# Patient Record
Sex: Male | Born: 1975
Health system: Southern US, Community
[De-identification: ages and names within clinical notes are randomized; demographics above are authoritative.]

## PROBLEM LIST (undated history)

## (undated) DIAGNOSIS — F419 Anxiety disorder, unspecified: Secondary | ICD-10-CM

## (undated) DIAGNOSIS — R002 Palpitations: Secondary | ICD-10-CM

## (undated) DIAGNOSIS — A6000 Herpesviral infection of urogenital system, unspecified: Secondary | ICD-10-CM

## (undated) DIAGNOSIS — M545 Low back pain, unspecified: Secondary | ICD-10-CM

## (undated) DIAGNOSIS — I10 Essential (primary) hypertension: Secondary | ICD-10-CM

## (undated) DIAGNOSIS — E7800 Pure hypercholesterolemia, unspecified: Secondary | ICD-10-CM

## (undated) DIAGNOSIS — R55 Syncope and collapse: Secondary | ICD-10-CM

## (undated) DIAGNOSIS — K219 Gastro-esophageal reflux disease without esophagitis: Secondary | ICD-10-CM

## (undated) HISTORY — DX: Low back pain: M54.5

## (undated) HISTORY — DX: Essential (primary) hypertension: I10

## (undated) HISTORY — DX: Palpitations: R00.2

## (undated) HISTORY — DX: Syncope and collapse: R55

## (undated) HISTORY — DX: Gastro-esophageal reflux disease without esophagitis: K21.9

## (undated) HISTORY — DX: Pure hypercholesterolemia, unspecified: E78.00

## (undated) HISTORY — DX: Anxiety disorder, unspecified: F41.9

## (undated) HISTORY — DX: Herpesviral infection of urogenital system, unspecified: A60.00

## (undated) HISTORY — PX: CHOLECYSTECTOMY: SHX55

## (undated) HISTORY — DX: Low back pain, unspecified: M54.50

---

## 2015-04-25 ENCOUNTER — Emergency Department (HOSPITAL_COMMUNITY): Payer: Self-pay

## 2015-04-25 ENCOUNTER — Emergency Department (HOSPITAL_COMMUNITY)
Admission: EM | Admit: 2015-04-25 | Discharge: 2015-04-26 | Discharge status: Against Medical Advice | Payer: Self-pay | Attending: Emergency Medicine | Admitting: Emergency Medicine

## 2015-04-25 ENCOUNTER — Encounter (HOSPITAL_COMMUNITY): Payer: Self-pay | Admitting: *Deleted

## 2015-04-25 DIAGNOSIS — R079 Chest pain, unspecified: Secondary | ICD-10-CM | POA: Insufficient documentation

## 2015-04-25 LAB — BASIC METABOLIC PANEL
Anion gap: 10 (ref 5–15)
BUN: 18 mg/dL (ref 6–20)
CO2: 26 mmol/L (ref 22–32)
CREATININE: 0.84 mg/dL (ref 0.61–1.24)
Calcium: 9.3 mg/dL (ref 8.9–10.3)
Chloride: 104 mmol/L (ref 101–111)
GFR calc Af Amer: >60 mL/min (ref >60)
GFR calc non Af Amer: >60 mL/min (ref >60)
GLUCOSE: 97 mg/dL (ref 70–99)
POTASSIUM: 3.7 mmol/L (ref 3.5–5.1)
Sodium: 140 mmol/L (ref 135–145)

## 2015-04-25 LAB — CBC
HCT: 45.2 % (ref 39.0–52.0)
Hemoglobin: 15.7 g/dL (ref 13.0–17.0)
MCH: 30.8 pg (ref 26.0–34.0)
MCHC: 34.7 g/dL (ref 30.0–36.0)
MCV: 88.8 fL (ref 78.0–100.0)
Platelets: 231 10*3/uL (ref 150–400)
RBC: 5.09 MIL/uL (ref 4.22–5.81)
RDW: 11.6 % (ref 11.5–15.5)
WBC: 5.2 10*3/uL (ref 4.0–10.5)

## 2015-04-25 LAB — I-STAT TROPONIN, ED: TROPONIN I, POC: 0 ng/mL (ref 0.00–0.08)

## 2015-04-25 NOTE — ED Notes (Signed)
Called for re-assessment with no answer 

## 2015-04-25 NOTE — ED Notes (Signed)
Pt has had intermittent chest pain over the last year.  Today he developed left sided chest pain that radiated down his left arm.  Pt states it has been continuous all day, nothing different.  No sob.  Pt has had some weird breakouts into sweats and hot flashes.  No family history

## 2015-04-25 NOTE — ED Notes (Signed)
Pts name called for a room no answer 

## 2015-12-30 IMAGING — CR DG CHEST 2V
2 series · 2 of 2 positions shown · non-contrast
Comparison: None.

CLINICAL DATA: Chest pain. Initial encounter. LEFT-sided chest pain
and arm pain.

EXAM:
CHEST  2 VIEW

[w chest pa]
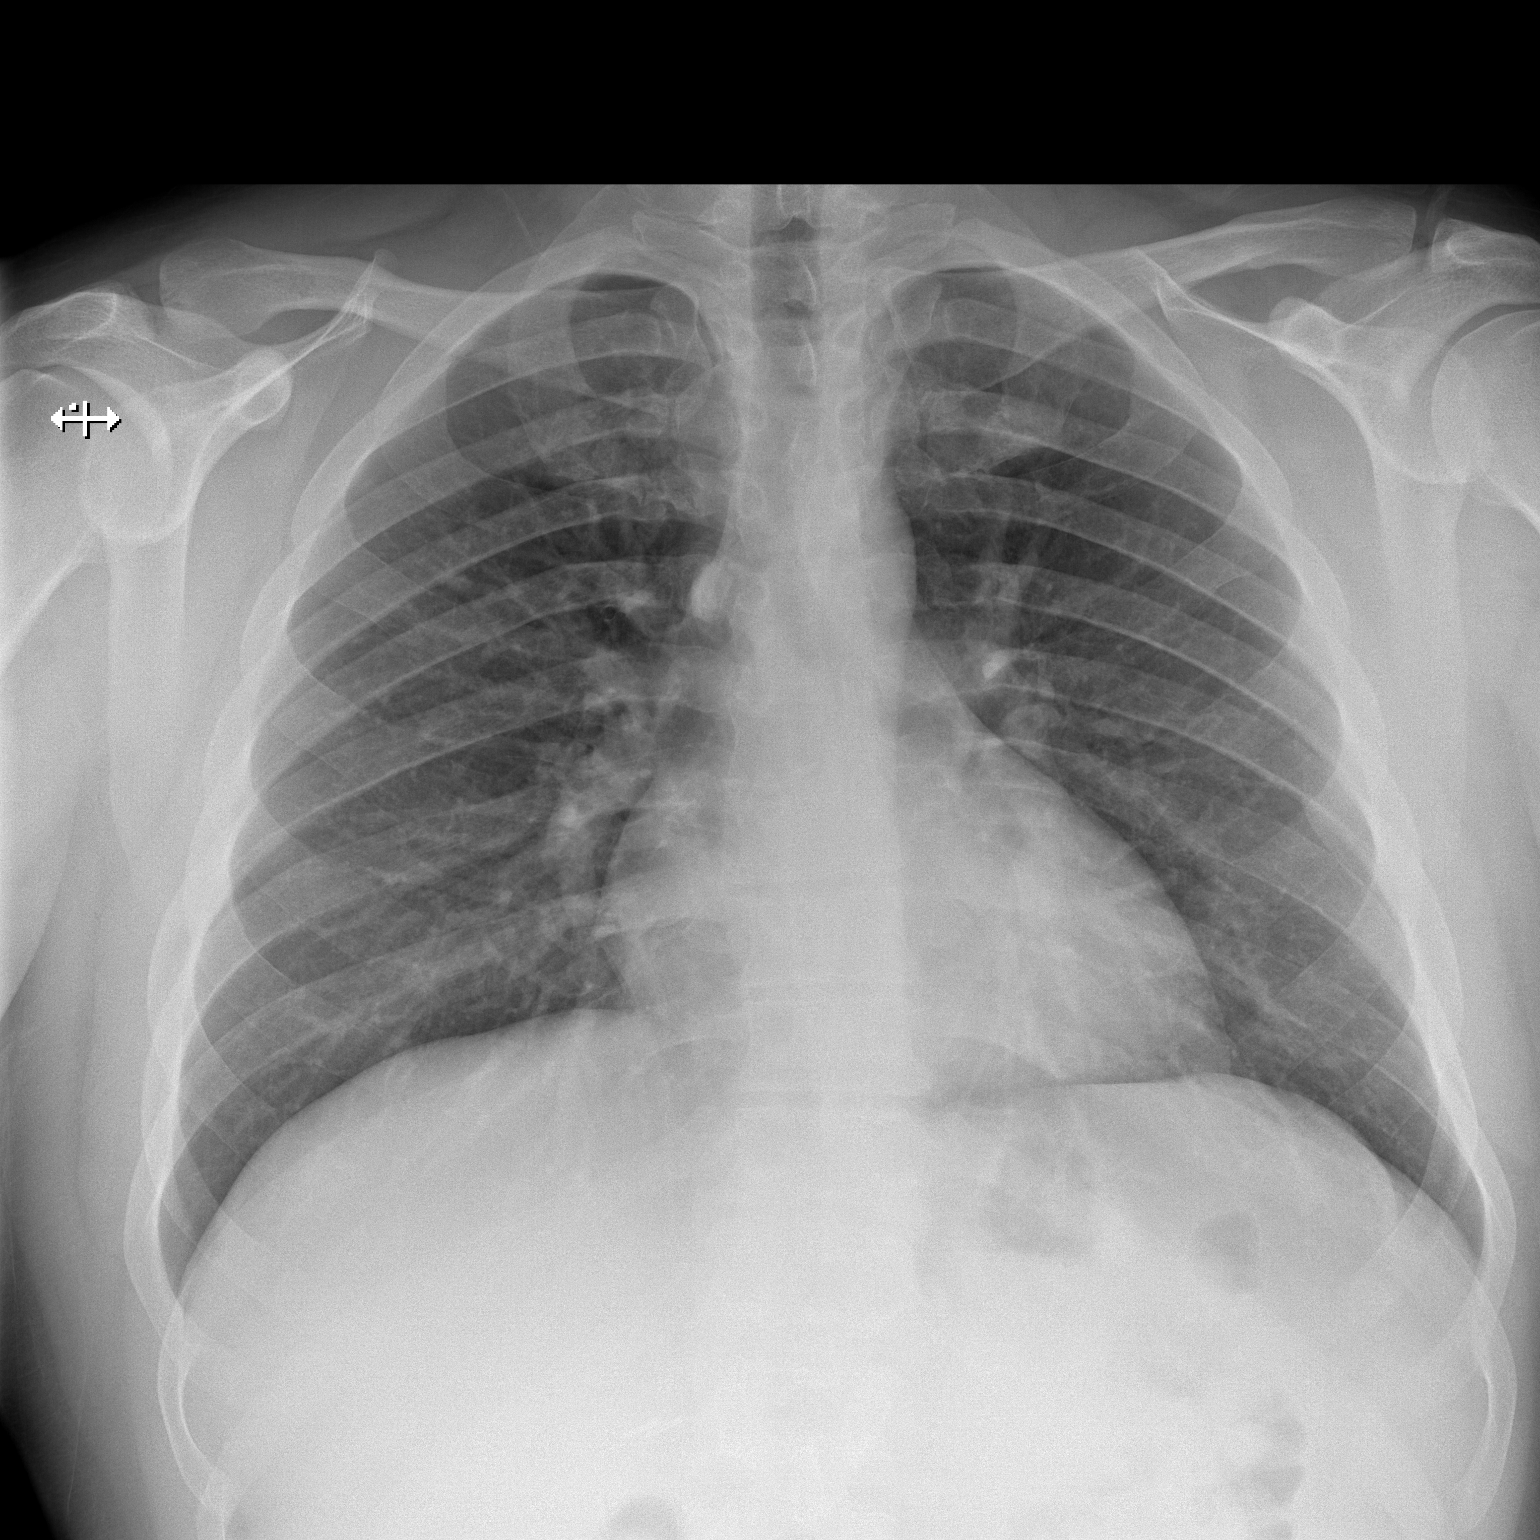

[w chest lat]
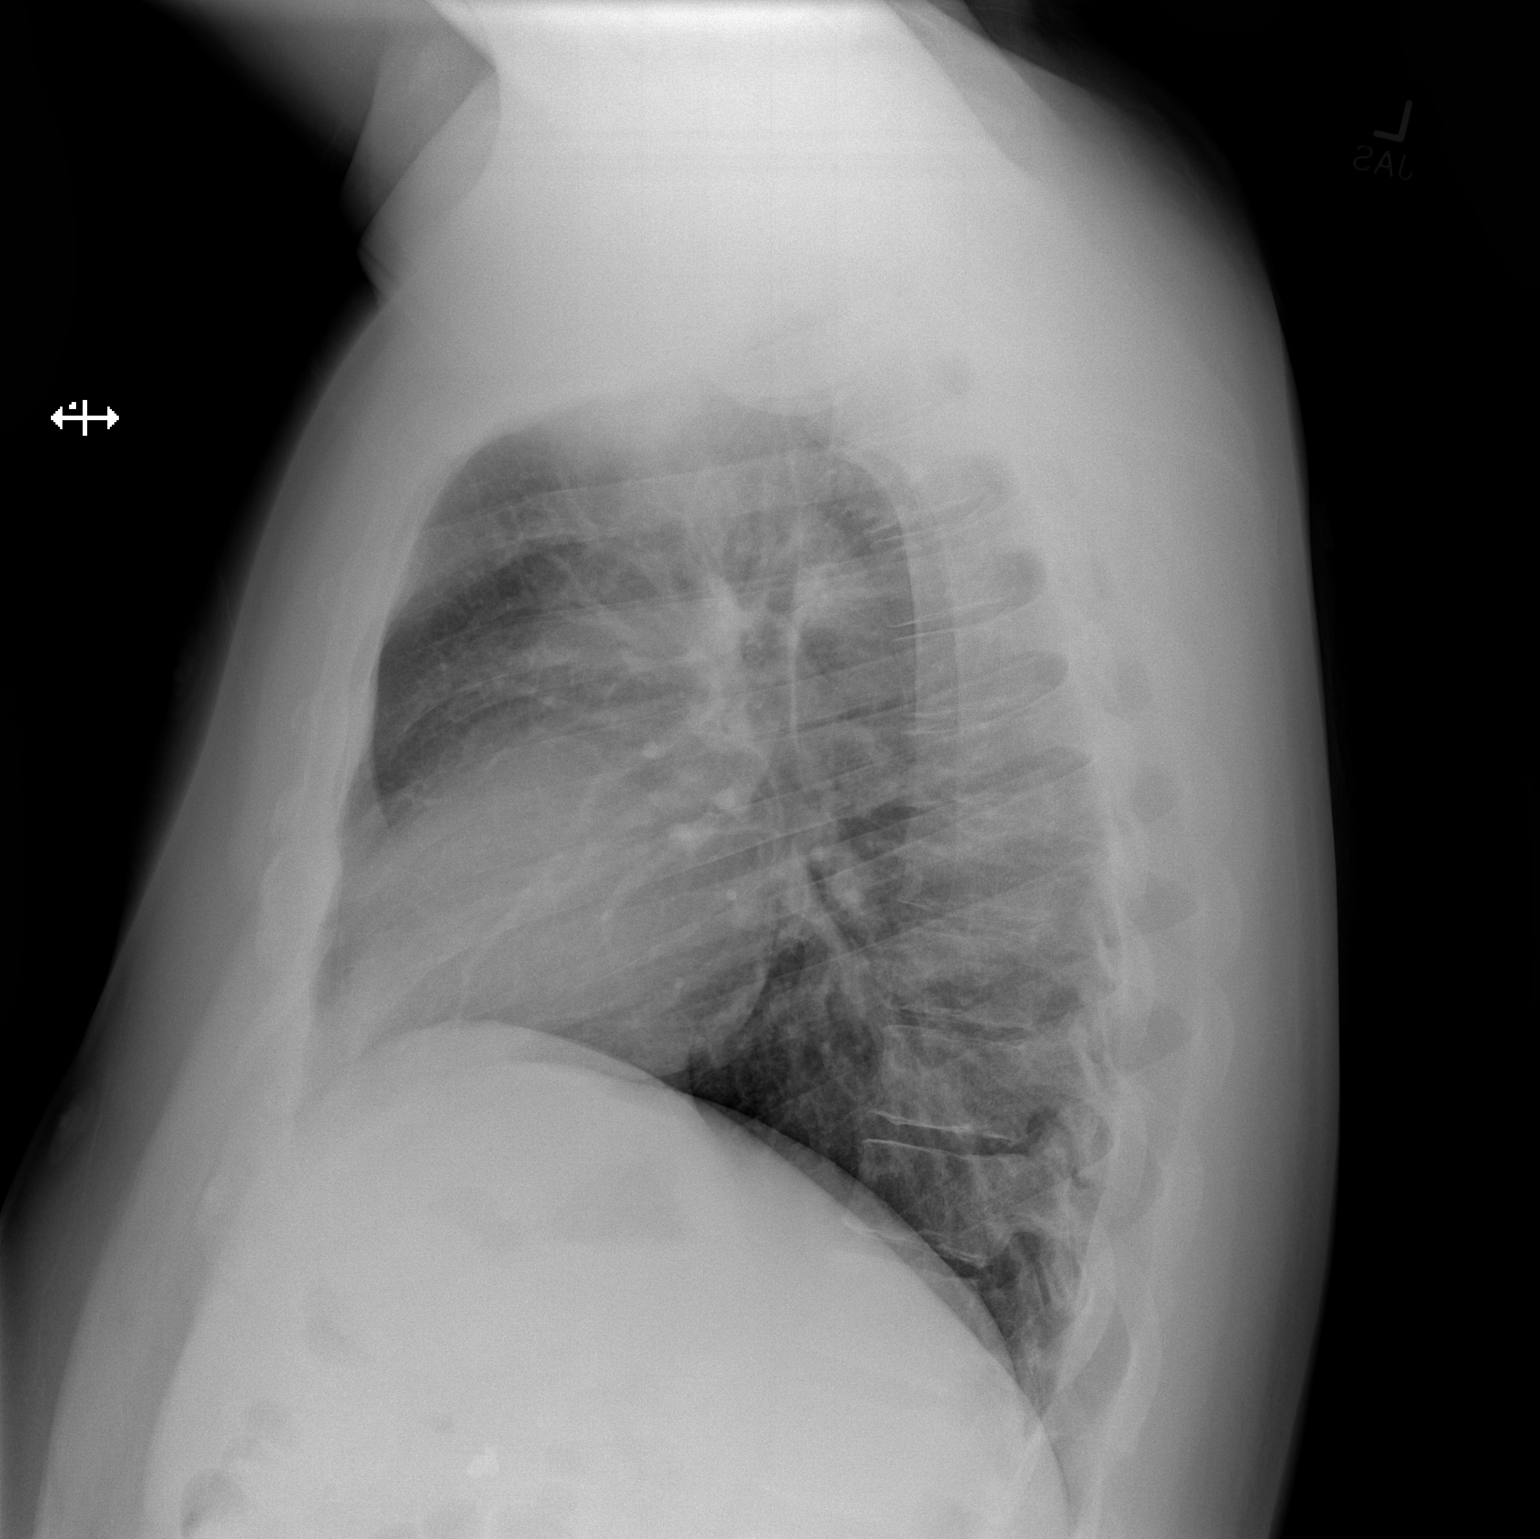

[2 of 2 positions shown; findings below may reference images not displayed]

FINDINGS: The heart size and mediastinal contours are within normal limits.
Both lungs are clear. The visualized skeletal structures are
unremarkable.
IMPRESSION: No active cardiopulmonary disease.

## 2016-07-05 DIAGNOSIS — K219 Gastro-esophageal reflux disease without esophagitis: Secondary | ICD-10-CM | POA: Diagnosis not present

## 2016-07-05 DIAGNOSIS — G4733 Obstructive sleep apnea (adult) (pediatric): Secondary | ICD-10-CM | POA: Diagnosis not present

## 2016-07-05 DIAGNOSIS — R42 Dizziness and giddiness: Secondary | ICD-10-CM | POA: Diagnosis not present

## 2016-07-05 DIAGNOSIS — F419 Anxiety disorder, unspecified: Secondary | ICD-10-CM | POA: Diagnosis not present

## 2016-07-23 DIAGNOSIS — F419 Anxiety disorder, unspecified: Secondary | ICD-10-CM | POA: Diagnosis not present

## 2016-07-23 DIAGNOSIS — G4733 Obstructive sleep apnea (adult) (pediatric): Secondary | ICD-10-CM | POA: Diagnosis not present

## 2016-07-23 DIAGNOSIS — M5416 Radiculopathy, lumbar region: Secondary | ICD-10-CM | POA: Diagnosis not present

## 2016-07-23 DIAGNOSIS — E782 Mixed hyperlipidemia: Secondary | ICD-10-CM | POA: Diagnosis not present

## 2016-08-01 DIAGNOSIS — M545 Low back pain: Secondary | ICD-10-CM | POA: Diagnosis not present

## 2016-10-02 DIAGNOSIS — M47898 Other spondylosis, sacral and sacrococcygeal region: Secondary | ICD-10-CM | POA: Diagnosis not present

## 2016-10-02 DIAGNOSIS — M545 Low back pain: Secondary | ICD-10-CM | POA: Diagnosis not present

## 2016-10-02 DIAGNOSIS — M48061 Spinal stenosis, lumbar region without neurogenic claudication: Secondary | ICD-10-CM | POA: Diagnosis not present

## 2016-10-02 DIAGNOSIS — M5126 Other intervertebral disc displacement, lumbar region: Secondary | ICD-10-CM | POA: Diagnosis not present

## 2016-10-02 DIAGNOSIS — M5127 Other intervertebral disc displacement, lumbosacral region: Secondary | ICD-10-CM | POA: Diagnosis not present

## 2016-10-02 DIAGNOSIS — M79604 Pain in right leg: Secondary | ICD-10-CM | POA: Diagnosis not present

## 2016-10-02 DIAGNOSIS — M79605 Pain in left leg: Secondary | ICD-10-CM | POA: Diagnosis not present

## 2016-10-02 DIAGNOSIS — R200 Anesthesia of skin: Secondary | ICD-10-CM | POA: Diagnosis not present

## 2016-10-02 DIAGNOSIS — M9943 Connective tissue stenosis of neural canal of lumbar region: Secondary | ICD-10-CM | POA: Diagnosis not present

## 2016-10-11 DIAGNOSIS — M48061 Spinal stenosis, lumbar region without neurogenic claudication: Secondary | ICD-10-CM | POA: Diagnosis not present

## 2016-10-25 DIAGNOSIS — M48061 Spinal stenosis, lumbar region without neurogenic claudication: Secondary | ICD-10-CM | POA: Diagnosis not present

## 2016-11-11 DIAGNOSIS — I10 Essential (primary) hypertension: Secondary | ICD-10-CM | POA: Diagnosis not present

## 2016-11-11 DIAGNOSIS — M25562 Pain in left knee: Secondary | ICD-10-CM | POA: Diagnosis not present

## 2016-11-11 DIAGNOSIS — M25561 Pain in right knee: Secondary | ICD-10-CM | POA: Diagnosis not present

## 2017-01-02 DIAGNOSIS — G4733 Obstructive sleep apnea (adult) (pediatric): Secondary | ICD-10-CM | POA: Diagnosis not present

## 2017-01-02 DIAGNOSIS — M5416 Radiculopathy, lumbar region: Secondary | ICD-10-CM | POA: Diagnosis not present

## 2017-01-02 DIAGNOSIS — E782 Mixed hyperlipidemia: Secondary | ICD-10-CM | POA: Diagnosis not present

## 2017-01-02 DIAGNOSIS — F419 Anxiety disorder, unspecified: Secondary | ICD-10-CM | POA: Diagnosis not present

## 2017-10-28 DIAGNOSIS — R5383 Other fatigue: Secondary | ICD-10-CM | POA: Diagnosis not present

## 2017-10-28 DIAGNOSIS — Z6838 Body mass index (BMI) 38.0-38.9, adult: Secondary | ICD-10-CM | POA: Diagnosis not present

## 2017-10-28 DIAGNOSIS — Z202 Contact with and (suspected) exposure to infections with a predominantly sexual mode of transmission: Secondary | ICD-10-CM | POA: Diagnosis not present

## 2017-10-28 DIAGNOSIS — B372 Candidiasis of skin and nail: Secondary | ICD-10-CM | POA: Diagnosis not present

## 2017-10-28 DIAGNOSIS — E782 Mixed hyperlipidemia: Secondary | ICD-10-CM | POA: Diagnosis not present

## 2018-01-27 DIAGNOSIS — Z6830 Body mass index (BMI) 30.0-30.9, adult: Secondary | ICD-10-CM | POA: Diagnosis not present

## 2018-01-27 DIAGNOSIS — M48061 Spinal stenosis, lumbar region without neurogenic claudication: Secondary | ICD-10-CM | POA: Diagnosis not present

## 2018-02-03 DIAGNOSIS — M48061 Spinal stenosis, lumbar region without neurogenic claudication: Secondary | ICD-10-CM | POA: Diagnosis not present

## 2018-02-20 DIAGNOSIS — M4316 Spondylolisthesis, lumbar region: Secondary | ICD-10-CM | POA: Diagnosis not present

## 2018-02-20 DIAGNOSIS — M48061 Spinal stenosis, lumbar region without neurogenic claudication: Secondary | ICD-10-CM | POA: Diagnosis not present

## 2018-02-20 DIAGNOSIS — Z6830 Body mass index (BMI) 30.0-30.9, adult: Secondary | ICD-10-CM | POA: Diagnosis not present

## 2018-02-27 DIAGNOSIS — H6502 Acute serous otitis media, left ear: Secondary | ICD-10-CM | POA: Diagnosis not present

## 2018-02-27 DIAGNOSIS — Z6839 Body mass index (BMI) 39.0-39.9, adult: Secondary | ICD-10-CM | POA: Diagnosis not present

## 2018-06-16 DIAGNOSIS — Z6839 Body mass index (BMI) 39.0-39.9, adult: Secondary | ICD-10-CM | POA: Diagnosis not present

## 2018-06-16 DIAGNOSIS — M7041 Prepatellar bursitis, right knee: Secondary | ICD-10-CM | POA: Diagnosis not present

## 2018-07-02 DIAGNOSIS — M7989 Other specified soft tissue disorders: Secondary | ICD-10-CM | POA: Diagnosis not present

## 2018-07-02 DIAGNOSIS — I10 Essential (primary) hypertension: Secondary | ICD-10-CM | POA: Diagnosis not present

## 2018-07-02 DIAGNOSIS — L03114 Cellulitis of left upper limb: Secondary | ICD-10-CM | POA: Diagnosis not present

## 2018-09-18 DIAGNOSIS — Z202 Contact with and (suspected) exposure to infections with a predominantly sexual mode of transmission: Secondary | ICD-10-CM | POA: Diagnosis not present

## 2018-09-18 DIAGNOSIS — Z6837 Body mass index (BMI) 37.0-37.9, adult: Secondary | ICD-10-CM | POA: Diagnosis not present

## 2018-09-18 DIAGNOSIS — R634 Abnormal weight loss: Secondary | ICD-10-CM | POA: Diagnosis not present

## 2018-09-18 DIAGNOSIS — R5383 Other fatigue: Secondary | ICD-10-CM | POA: Diagnosis not present

## 2018-10-30 DIAGNOSIS — R5383 Other fatigue: Secondary | ICD-10-CM | POA: Diagnosis not present

## 2018-10-30 DIAGNOSIS — Z6837 Body mass index (BMI) 37.0-37.9, adult: Secondary | ICD-10-CM | POA: Diagnosis not present

## 2018-10-30 DIAGNOSIS — B351 Tinea unguium: Secondary | ICD-10-CM | POA: Diagnosis not present

## 2018-10-30 DIAGNOSIS — R634 Abnormal weight loss: Secondary | ICD-10-CM | POA: Diagnosis not present

## 2018-12-04 DIAGNOSIS — R5383 Other fatigue: Secondary | ICD-10-CM | POA: Diagnosis not present

## 2018-12-04 DIAGNOSIS — Z6827 Body mass index (BMI) 27.0-27.9, adult: Secondary | ICD-10-CM | POA: Diagnosis not present

## 2018-12-04 DIAGNOSIS — R634 Abnormal weight loss: Secondary | ICD-10-CM | POA: Diagnosis not present

## 2018-12-04 DIAGNOSIS — L0291 Cutaneous abscess, unspecified: Secondary | ICD-10-CM | POA: Diagnosis not present

## 2019-01-08 DIAGNOSIS — F419 Anxiety disorder, unspecified: Secondary | ICD-10-CM | POA: Diagnosis not present

## 2019-01-08 DIAGNOSIS — Z6829 Body mass index (BMI) 29.0-29.9, adult: Secondary | ICD-10-CM | POA: Diagnosis not present

## 2019-01-08 DIAGNOSIS — R5383 Other fatigue: Secondary | ICD-10-CM | POA: Diagnosis not present

## 2019-01-12 ENCOUNTER — Ambulatory Visit (INDEPENDENT_AMBULATORY_CARE_PROVIDER_SITE_OTHER): Payer: BLUE CROSS/BLUE SHIELD | Admitting: Otolaryngology

## 2019-01-12 DIAGNOSIS — J342 Deviated nasal septum: Secondary | ICD-10-CM | POA: Diagnosis not present

## 2019-01-12 DIAGNOSIS — R490 Dysphonia: Secondary | ICD-10-CM | POA: Diagnosis not present

## 2019-01-12 DIAGNOSIS — J310 Chronic rhinitis: Secondary | ICD-10-CM | POA: Diagnosis not present

## 2019-01-30 DIAGNOSIS — F419 Anxiety disorder, unspecified: Secondary | ICD-10-CM | POA: Diagnosis not present

## 2019-01-30 DIAGNOSIS — R634 Abnormal weight loss: Secondary | ICD-10-CM | POA: Diagnosis not present

## 2019-01-30 DIAGNOSIS — R5383 Other fatigue: Secondary | ICD-10-CM | POA: Diagnosis not present

## 2019-01-30 DIAGNOSIS — R300 Dysuria: Secondary | ICD-10-CM | POA: Diagnosis not present

## 2019-01-30 DIAGNOSIS — Z6829 Body mass index (BMI) 29.0-29.9, adult: Secondary | ICD-10-CM | POA: Diagnosis not present

## 2019-02-04 DIAGNOSIS — R079 Chest pain, unspecified: Secondary | ICD-10-CM | POA: Diagnosis not present

## 2019-02-04 DIAGNOSIS — I10 Essential (primary) hypertension: Secondary | ICD-10-CM | POA: Diagnosis not present

## 2019-02-04 DIAGNOSIS — R002 Palpitations: Secondary | ICD-10-CM | POA: Diagnosis not present

## 2019-02-05 DIAGNOSIS — R55 Syncope and collapse: Secondary | ICD-10-CM | POA: Diagnosis not present

## 2019-02-05 DIAGNOSIS — Z6829 Body mass index (BMI) 29.0-29.9, adult: Secondary | ICD-10-CM | POA: Diagnosis not present

## 2019-02-05 DIAGNOSIS — R002 Palpitations: Secondary | ICD-10-CM | POA: Diagnosis not present

## 2019-02-23 ENCOUNTER — Ambulatory Visit (INDEPENDENT_AMBULATORY_CARE_PROVIDER_SITE_OTHER): Payer: BLUE CROSS/BLUE SHIELD | Admitting: Otolaryngology

## 2019-03-03 ENCOUNTER — Encounter: Payer: Self-pay | Admitting: *Deleted

## 2019-03-03 NOTE — Progress Notes (Deleted)
    Cardiology Office Note  Date: 03/03/2019   ID: Glenn Berry, DOB [birth year], MRN [id number]  PCP: Richardean Chimera, MD  Consulting Cardiologist: Nona Dell, MD   No chief complaint on file.   History of Present Illness: Glenn Berry is a 43 y.o. male referred for cardiology consultation by Dr. Reuel Boom for the evaluation of palpitations and near syncope.  Past Medical History:  Diagnosis Date  . Anxiety   . Genital herpes   . GERD (gastroesophageal reflux disease)   . Hypercholesteremia   . Hypertension   . Low back pain     Past Surgical History:  Procedure Laterality Date  . CHOLECYSTECTOMY      Current Outpatient Medications  Medication Sig Dispense Refill  . ibuprofen (ADVIL,MOTRIN) 200 MG tablet Take 200 mg by mouth every 6 (six) hours as needed for moderate pain.    Marland Kitchen omeprazole (PRILOSEC) 20 MG capsule Take 20 mg by mouth daily.     No current facility-administered medications for this visit.    Allergies:  Patient has no known allergies.   Social History: The patient  reports that he quit smoking about 7 years ago. His smoking use included cigarettes. He has never used smokeless tobacco. He reports that he does not drink alcohol or use drugs.   Family History: The patient's family history includes CAD in his mother; Hyperlipidemia in his sister; Hypertension in his mother.   ROS:  Please see the history of present illness. Otherwise, complete review of systems is positive for {NONE DEFAULTED:18576::"none"}.  All other systems are reviewed and negative.   Physical Exam: VS:  There were no vitals taken for this visit., BMI There is no height or weight on file to calculate BMI.  Wt Readings from Last 3 Encounters:  No data found for Wt    General: Patient appears comfortable at rest. HEENT: Conjunctiva and lids normal, oropharynx clear with moist mucosa. Neck: Supple, no elevated JVP or carotid bruits, no thyromegaly. Lungs: Clear to auscultation,  nonlabored breathing at rest. Cardiac: Regular rate and rhythm, no S3 or significant systolic murmur, no pericardial rub. Abdomen: Soft, nontender, no hepatomegaly, bowel sounds present, no guarding or rebound. Extremities: No pitting edema, distal pulses 2+. Skin: Warm and dry. Musculoskeletal: No kyphosis. Neuropsychiatric: Alert and oriented x3, affect grossly appropriate.  ECG: I personally reviewed the tracing from 02/04/2019 which showed sinus rhythm.  Recent Labwork:  February 2020: BUN 14, creatinine 0.94, potassium 4.3, AST 23, ALT 29 globin 16.3, platelets 233 July 2017: Cholesterol 220, triglycerides 349, HDL 44, LDL 106  Other Studies Reviewed Today:  Chest x-ray 04/25/2015: FINDINGS: The heart size and mediastinal contours are within normal limits. Both lungs are clear. The visualized skeletal structures are unremarkable.  IMPRESSION: No active cardiopulmonary disease.  Assessment and Plan:    Current medicines were reviewed with the patient today.  No orders of the defined types were placed in this encounter.   Disposition:  Signed, Jonelle Sidle, MD, [facility] 03/03/2019 3:29 PM    [facility] at [facility] [address] [place], [place], [place] [address] Phone: [phone]; Fax: [phone]

## 2019-03-04 ENCOUNTER — Ambulatory Visit: Payer: BLUE CROSS/BLUE SHIELD | Admitting: Cardiology

## 2019-04-10 ENCOUNTER — Telehealth: Payer: Self-pay | Admitting: *Deleted

## 2019-04-10 NOTE — Telephone Encounter (Signed)
Pt verbalized consent for telehealth webex appt with Dr Diona Browner 04/16/19. Pt given instructions for webex and confirmed email. Pt will have BP/HR/weight available.

## 2019-04-15 ENCOUNTER — Encounter: Payer: Self-pay | Admitting: *Deleted

## 2019-04-15 NOTE — Progress Notes (Signed)
Virtual Visit via Telephone Note   This visit type was conducted due to national recommendations for restrictions regarding the COVID-19 Pandemic (e.g. social distancing) in an effort to limit this patient's exposure and mitigate transmission in our community.  Due to his co-morbid illnesses, this patient is at least at moderate risk for complications without adequate follow up.  This format is felt to be most appropriate for this patient at this time.  The patient did not have access to video technology/had technical difficulties with video requiring transitioning to audio format only (telephone).  All issues noted in this document were discussed and addressed.  No physical exam could be performed with this format.  Please refer to the patient's chart for his  consent to telehealth for [facility].   Evaluation Performed: New patient consultation  Date:  04/16/2019   ID:  Glenn Berry, DOB 1976/02/11, MRN [id number]  Patient Location: Home Provider Location: Office  PCP:  Lovey Newcomer, PA  Consulting Cardiologist:  Nona Dell, MD  Chief Complaint:  Palpitations and lightheadedness  History of Present Illness:    Glenn Berry is a 43 y.o. male referred for cardiology consultation by Dr. Reuel Boom for the evaluation of palpitations and history of near syncope.  We attempted video conferencing today however had technical difficulties and I spoke with him by phone.  He tells me that for years he has been experiencing intermittent episodes of palpitations and lightheadedness.  This has become more frequent within the last few months, he was seen in the ER back in February.  He describes feeling "just not right" with generalized lightheadedness but no vertigo, also sweating and a feeling of rapid palpitations.  This usually lasts for a few minutes.  It can occur at rest or while standing.  He has never had frank syncope and has no associated chest pain.  I reviewed his ECG and lab work  from February as outlined below.  I went over his medications which are listed below.  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  He spends a lot of time with repairs.  The patient does not have symptoms concerning for COVID-19 infection (fever, chills, cough, or new shortness of breath).    Past Medical History:  Diagnosis Date  . Anxiety   . Genital herpes   . GERD (gastroesophageal reflux disease)   . Hypercholesteremia   . Hypertension   . Low back pain   . Near syncope   . Palpitations    Past Surgical History:  Procedure Laterality Date  . CHOLECYSTECTOMY       Current Meds  Medication Sig  . acyclovir (ZOVIRAX) 400 MG tablet Take 400 mg by mouth 2 (two) times daily.  Marland Kitchen buPROPion (WELLBUTRIN SR) 150 MG 12 hr tablet Take 150 mg by mouth daily.  . fluticasone (FLONASE) 50 MCG/ACT nasal spray Place 2 sprays into both nostrils daily.  Marland Kitchen gabapentin (NEURONTIN) 100 MG capsule Take 100 mg by mouth 3 (three) times daily.  Marland Kitchen ibuprofen (ADVIL,MOTRIN) 200 MG tablet Take 200 mg by mouth every 6 (six) hours as needed for moderate pain.  Marland Kitchen omeprazole (PRILOSEC) 20 MG capsule Take 20 mg by mouth daily.  Marland Kitchen venlafaxine (EFFEXOR) 37.5 MG tablet Take 75 mg by mouth daily.     Allergies:   Patient has no known allergies.   Social History   Tobacco Use  . Smoking status: Former Smoker    Types: Cigarettes    Last  attempt to quit: 12/25/2011    Years since quitting: 7.3  . Smokeless tobacco: Never Used  Substance Use Topics  . Alcohol use: No  . Drug use: No     Family Hx: The patient's family history includes CAD in his mother; Hyperlipidemia in his sister; Hypertension in his mother.  ROS:   Please see the history of present illness.    Hyperhidrosis All other systems reviewed and are negative.   Prior CV studies:   The following studies were reviewed today:  No prior studies for review.  Labs/Other Tests and Data Reviewed:    EKG:  An ECG dated  02/04/2019 was personally reviewed today and demonstrated:  Normal sinus rhythm.  Recent Labs:  July 2017: Cholesterol 220, triglycerides 349, HDL 44, LDL 106 November 2018: TSH 1.93 February 2020: BUN 14, creatinine 0.94, potassium 4.3, AST 23, ALT 29 hemoglobin 16.3, platelets 233   Wt Readings from Last 3 Encounters:  04/16/19 232 lb (105.2 kg)  04/15/19 232 lb 9.6 oz (105.5 kg)     Objective:    Vital Signs:  Ht 6\' 3"  (1.905 m)   Wt 232 lb (105.2 kg)   BMI 29.00 kg/m    He was not able to check his vital signs today. He spoke with normal voice tone and speech cadence.  He answer questions spontaneously.  Breathing was not labored while speaking in full sentences.  ASSESSMENT & PLAN:    1.  Intermittent palpitations and lightheadedness without frank syncope or chest pain. He states that this has been going on for years but has increased in frequency of late.  I reviewed his lab work from February.  No family history of sudden unexplained cardiac death.  No obvious preexcitation, Brugada pattern, or prolonged QT interval by ECG.  Plan is to obtain an echocardiogram for cardiac structural assessment and also a 7-day ZIO patch for further evaluation.  2.  History of hypertension, diet managed.  He is currently not on any antihypertensive medications with follow-up per PCP.  3.  GERD on Prilosec.  COVID-19 Education: The signs and symptoms of COVID-19 were discussed with the patient and how to seek care for testing (follow up with PCP or arrange E-visit).  The importance of social distancing was discussed today.  Time:   Today, I have spent 7 minutes with the patient with telehealth technology discussing the above problems.     Medication Adjustments/Labs and Tests Ordered: Current medicines are reviewed at length with the patient today.  Concerns regarding medicines are outlined above.   Tests Ordered: Orders Placed This Encounter  Procedures  . LONG TERM MONITOR-LIVE  TELEMETRY (3-14 DAYS)  . ECHOCARDIOGRAM COMPLETE    Medication Changes: No orders of the defined types were placed in this encounter.   Disposition:  Follow up test results and determine disposition.  Signed, Nona DellSamuel McDowell, MD  04/16/2019 1:24 PM    [facility]

## 2019-04-16 ENCOUNTER — Encounter: Payer: Self-pay | Admitting: Cardiology

## 2019-04-16 ENCOUNTER — Telehealth: Payer: Self-pay | Admitting: Cardiology

## 2019-04-16 ENCOUNTER — Telehealth (INDEPENDENT_AMBULATORY_CARE_PROVIDER_SITE_OTHER): Payer: BLUE CROSS/BLUE SHIELD | Admitting: Cardiology

## 2019-04-16 VITALS — Ht 75.0 in | Wt 232.0 lb

## 2019-04-16 DIAGNOSIS — R002 Palpitations: Secondary | ICD-10-CM

## 2019-04-16 DIAGNOSIS — Z7189 Other specified counseling: Secondary | ICD-10-CM

## 2019-04-16 DIAGNOSIS — R55 Syncope and collapse: Secondary | ICD-10-CM | POA: Diagnosis not present

## 2019-04-16 NOTE — Telephone Encounter (Signed)
Pre-cert Verification for the following procedure    Echo Scheduled 04/29/2019 at [facility]  7 Day ZIO patch

## 2019-04-16 NOTE — Patient Instructions (Addendum)
Medication Instructions:   Your physician recommends that you continue on your current medications as directed. Please refer to the Current Medication list given to you today.  Labwork:  NONE  Testing/Procedures: Your physician has requested that you have an echocardiogram. Echocardiography is a painless test that uses sound waves to create images of your heart. It provides your doctor with information about the size and shape of your heart and how well your heart's chambers and valves are working. This procedure takes approximately one hour. There are no restrictions for this procedure. Your physician has recommended that you wear an event monitor. Event monitors are medical devices that record the heart's electrical activity. Doctors most often [place] these monitors to diagnose arrhythmias. Arrhythmias are problems with the speed or rhythm of the heartbeat. The monitor is a small, portable device. You can wear one while you do your normal daily activities. This is usually used to diagnose what is causing palpitations/syncope (passing out).  Follow-Up:  Your physician recommends that you schedule a follow-up appointment in:   Any Other Special Instructions Will Be Listed Below (If Applicable).  If you need a refill on your cardiac medications before your next appointment, please call your pharmacy.

## 2019-04-23 ENCOUNTER — Other Ambulatory Visit: Payer: Self-pay | Admitting: Cardiology

## 2019-04-23 DIAGNOSIS — R55 Syncope and collapse: Secondary | ICD-10-CM

## 2019-04-29 ENCOUNTER — Other Ambulatory Visit: Payer: BLUE CROSS/BLUE SHIELD

## 2019-05-03 DIAGNOSIS — I10 Essential (primary) hypertension: Secondary | ICD-10-CM | POA: Diagnosis not present

## 2019-05-03 DIAGNOSIS — S61512A Laceration without foreign body of left wrist, initial encounter: Secondary | ICD-10-CM | POA: Diagnosis not present

## 2019-05-03 DIAGNOSIS — W458XXA Other foreign body or object entering through skin, initial encounter: Secondary | ICD-10-CM | POA: Diagnosis not present

## 2019-05-06 DIAGNOSIS — Z6829 Body mass index (BMI) 29.0-29.9, adult: Secondary | ICD-10-CM | POA: Diagnosis not present

## 2019-05-06 DIAGNOSIS — F419 Anxiety disorder, unspecified: Secondary | ICD-10-CM | POA: Diagnosis not present

## 2019-05-08 DIAGNOSIS — S61512A Laceration without foreign body of left wrist, initial encounter: Secondary | ICD-10-CM | POA: Diagnosis not present

## 2019-05-12 ENCOUNTER — Telehealth: Payer: Self-pay | Admitting: *Deleted

## 2019-05-12 NOTE — Telephone Encounter (Signed)
° ° °  COVID-19 Pre-Screening Questions:   In the past 7 to 10 days have you had a cough,  shortness of breath, headache, congestion, fever, body aches, chills, sore throat, or sudden loss of taste or sense of smell?  NO    Have you been around anyone with known Covid 19.  NO    Have you been around anyone who is awaiting Covid 19 test results in the past 7 to 10 days?  No    Have you been around anyone who has been exposed to Covid 19, or has mentioned symptoms of Covid 19 within the past 7 to 10 days?  NO  If you have any concerns/questions about symptoms patients report during screening (either on the phone or at threshold). Contact the provider seeing the patient or DOD for further guidance.  If neither are available contact a member of the leadership team.

## 2019-05-13 ENCOUNTER — Other Ambulatory Visit: Payer: Self-pay

## 2019-05-13 ENCOUNTER — Ambulatory Visit (INDEPENDENT_AMBULATORY_CARE_PROVIDER_SITE_OTHER): Payer: BLUE CROSS/BLUE SHIELD

## 2019-05-13 DIAGNOSIS — R55 Syncope and collapse: Secondary | ICD-10-CM | POA: Diagnosis not present

## 2019-05-14 ENCOUNTER — Telehealth: Payer: Self-pay | Admitting: *Deleted

## 2019-05-14 NOTE — Telephone Encounter (Signed)
Patient informed. Copy sent to PCP °

## 2019-06-02 ENCOUNTER — Ambulatory Visit (HOSPITAL_COMMUNITY): Payer: Self-pay | Admitting: Psychiatry

## 2019-06-16 ENCOUNTER — Ambulatory Visit (HOSPITAL_COMMUNITY): Payer: BC Managed Care – PPO | Admitting: Psychiatry

## 2019-06-16 ENCOUNTER — Other Ambulatory Visit: Payer: Self-pay

## 2019-06-16 ENCOUNTER — Telehealth (HOSPITAL_COMMUNITY): Payer: Self-pay | Admitting: Psychiatry

## 2019-06-16 NOTE — Telephone Encounter (Signed)
Therapist left message indicating attempt to contact for secheduled appointment and requested patient call office

## 2019-07-03 ENCOUNTER — Telehealth: Payer: Self-pay | Admitting: *Deleted

## 2019-07-03 NOTE — Telephone Encounter (Signed)
7 day zio patch enrolled 04/23   05/21 sk/w/pt- he has not started wearing monitor-told him to go ahead and activate it-and send back to company    ZIO patch monitor not worn by patient per his choice

## 2022-03-15 DIAGNOSIS — R300 Dysuria: Secondary | ICD-10-CM | POA: Diagnosis not present

## 2022-03-15 DIAGNOSIS — Z6832 Body mass index (BMI) 32.0-32.9, adult: Secondary | ICD-10-CM | POA: Diagnosis not present

## 2022-03-15 DIAGNOSIS — I10 Essential (primary) hypertension: Secondary | ICD-10-CM | POA: Diagnosis not present

## 2022-12-31 DIAGNOSIS — R059 Cough, unspecified: Secondary | ICD-10-CM | POA: Diagnosis not present

## 2022-12-31 DIAGNOSIS — R030 Elevated blood-pressure reading, without diagnosis of hypertension: Secondary | ICD-10-CM | POA: Diagnosis not present

## 2022-12-31 DIAGNOSIS — Z6831 Body mass index (BMI) 31.0-31.9, adult: Secondary | ICD-10-CM | POA: Diagnosis not present

## 2022-12-31 DIAGNOSIS — J019 Acute sinusitis, unspecified: Secondary | ICD-10-CM | POA: Diagnosis not present

## 2023-05-16 DIAGNOSIS — M5416 Radiculopathy, lumbar region: Secondary | ICD-10-CM | POA: Diagnosis not present

## 2023-05-16 DIAGNOSIS — Z6833 Body mass index (BMI) 33.0-33.9, adult: Secondary | ICD-10-CM | POA: Diagnosis not present

## 2023-05-16 DIAGNOSIS — B351 Tinea unguium: Secondary | ICD-10-CM | POA: Diagnosis not present

## 2023-05-16 DIAGNOSIS — E7849 Other hyperlipidemia: Secondary | ICD-10-CM | POA: Diagnosis not present

## 2023-05-16 DIAGNOSIS — R030 Elevated blood-pressure reading, without diagnosis of hypertension: Secondary | ICD-10-CM | POA: Diagnosis not present

## 2023-08-28 DIAGNOSIS — F410 Panic disorder [episodic paroxysmal anxiety] without agoraphobia: Secondary | ICD-10-CM | POA: Diagnosis not present

## 2023-08-28 DIAGNOSIS — M549 Dorsalgia, unspecified: Secondary | ICD-10-CM | POA: Diagnosis not present

## 2023-08-28 DIAGNOSIS — Z9049 Acquired absence of other specified parts of digestive tract: Secondary | ICD-10-CM | POA: Diagnosis not present

## 2023-08-28 DIAGNOSIS — K5730 Diverticulosis of large intestine without perforation or abscess without bleeding: Secondary | ICD-10-CM | POA: Diagnosis not present

## 2023-08-28 DIAGNOSIS — G894 Chronic pain syndrome: Secondary | ICD-10-CM | POA: Diagnosis not present

## 2023-08-28 DIAGNOSIS — K760 Fatty (change of) liver, not elsewhere classified: Secondary | ICD-10-CM | POA: Diagnosis not present

## 2023-08-28 DIAGNOSIS — K4090 Unilateral inguinal hernia, without obstruction or gangrene, not specified as recurrent: Secondary | ICD-10-CM | POA: Diagnosis not present

## 2023-08-28 DIAGNOSIS — Z87891 Personal history of nicotine dependence: Secondary | ICD-10-CM | POA: Diagnosis not present

## 2023-08-28 DIAGNOSIS — R1031 Right lower quadrant pain: Secondary | ICD-10-CM | POA: Diagnosis not present

## 2023-08-28 DIAGNOSIS — N132 Hydronephrosis with renal and ureteral calculous obstruction: Secondary | ICD-10-CM | POA: Diagnosis not present
